# Patient Record
Sex: Male | Born: 1985 | Race: White | Hispanic: Yes | Marital: Single
Health system: Southern US, Community
[De-identification: ages and names within clinical notes are randomized; demographics above are authoritative.]

---

## 2016-03-01 ENCOUNTER — Emergency Department (HOSPITAL_COMMUNITY): Payer: No Typology Code available for payment source

## 2016-03-01 ENCOUNTER — Emergency Department (HOSPITAL_COMMUNITY)
Admission: EM | Admit: 2016-03-01 | Discharge: 2016-03-01 | Disposition: A | Discharge status: Home / Self Care | Payer: No Typology Code available for payment source | Attending: Emergency Medicine | Admitting: Emergency Medicine

## 2016-03-01 ENCOUNTER — Encounter (HOSPITAL_COMMUNITY): Payer: Self-pay

## 2016-03-01 DIAGNOSIS — S40819A Abrasion of unspecified upper arm, initial encounter: Secondary | ICD-10-CM

## 2016-03-01 DIAGNOSIS — S41112A Laceration without foreign body of left upper arm, initial encounter: Secondary | ICD-10-CM | POA: Diagnosis present

## 2016-03-01 DIAGNOSIS — Y92410 Unspecified street and highway as the place of occurrence of the external cause: Secondary | ICD-10-CM | POA: Insufficient documentation

## 2016-03-01 DIAGNOSIS — Y9389 Activity, other specified: Secondary | ICD-10-CM | POA: Insufficient documentation

## 2016-03-01 DIAGNOSIS — S40812A Abrasion of left upper arm, initial encounter: Secondary | ICD-10-CM | POA: Diagnosis not present

## 2016-03-01 DIAGNOSIS — S301XXA Contusion of abdominal wall, initial encounter: Secondary | ICD-10-CM | POA: Insufficient documentation

## 2016-03-01 DIAGNOSIS — Y998 Other external cause status: Secondary | ICD-10-CM | POA: Insufficient documentation

## 2016-03-01 DIAGNOSIS — Z23 Encounter for immunization: Secondary | ICD-10-CM | POA: Diagnosis not present

## 2016-03-01 LAB — COMPREHENSIVE METABOLIC PANEL
ALT: 31 U/L (ref 17–63)
AST: 32 U/L (ref 15–41)
Albumin: 4.1 g/dL (ref 3.5–5.0)
Alkaline Phosphatase: 71 U/L (ref 38–126)
Anion gap: 11 (ref 5–15)
BUN: 11 mg/dL (ref 6–20)
CALCIUM: 8.9 mg/dL (ref 8.9–10.3)
CO2: 22 mmol/L (ref 22–32)
CREATININE: 0.92 mg/dL (ref 0.61–1.24)
Chloride: 105 mmol/L (ref 101–111)
GFR calc Af Amer: >60 mL/min (ref >60)
GLUCOSE: 106 mg/dL — AB (ref 65–99)
Potassium: 3.8 mmol/L (ref 3.5–5.1)
Sodium: 138 mmol/L (ref 135–145)
TOTAL PROTEIN: 6.8 g/dL (ref 6.5–8.1)
Total Bilirubin: 0.6 mg/dL (ref 0.3–1.2)

## 2016-03-01 LAB — CBC WITH DIFFERENTIAL/PLATELET
BASOS ABS: 0 10*3/uL (ref 0.0–0.1)
BASOS PCT: 0 %
EOS PCT: 2 %
Eosinophils Absolute: 0.3 10*3/uL (ref 0.0–0.7)
HCT: 45.5 % (ref 39.0–52.0)
Hemoglobin: 15.7 g/dL (ref 13.0–17.0)
Lymphocytes Relative: 15 %
Lymphs Abs: 1.9 10*3/uL (ref 0.7–4.0)
MCH: 30 pg (ref 26.0–34.0)
MCHC: 34.5 g/dL (ref 30.0–36.0)
MCV: 86.8 fL (ref 78.0–100.0)
MONO ABS: 0.5 10*3/uL (ref 0.1–1.0)
Monocytes Relative: 4 %
Neutro Abs: 9.6 10*3/uL — ABNORMAL HIGH (ref 1.7–7.7)
Neutrophils Relative %: 79 %
PLATELETS: 209 10*3/uL (ref 150–400)
RBC: 5.24 MIL/uL (ref 4.22–5.81)
RDW: 13.3 % (ref 11.5–15.5)
WBC: 12.2 10*3/uL — ABNORMAL HIGH (ref 4.0–10.5)

## 2016-03-01 LAB — PROTIME-INR
INR: 1.05 (ref 0.00–1.49)
PROTHROMBIN TIME: 13.9 s (ref 11.6–15.2)

## 2016-03-01 MED ORDER — CEPHALEXIN 500 MG PO CAPS: 500.0000 mg | ORAL_CAPSULE | Freq: Three times a day (TID) | ORAL | Status: AC

## 2016-03-01 MED ORDER — LIDOCAINE-EPINEPHRINE (PF) 2 %-1:200000 IJ SOLN
10.0000 mL | Freq: Once | INTRAMUSCULAR | Status: AC
  Administered 2016-03-01: 10 mL
  Filled 2016-03-01: qty 20

## 2016-03-01 MED ORDER — MORPHINE SULFATE (PF) 4 MG/ML IV SOLN
4.0000 mg | Freq: Once | INTRAVENOUS | Status: AC
  Administered 2016-03-01: 4 mg via INTRAVENOUS
  Filled 2016-03-01: qty 1

## 2016-03-01 MED ORDER — TETANUS-DIPHTH-ACELL PERTUSSIS 5-2.5-18.5 LF-MCG/0.5 IM SUSP
0.5000 mL | Freq: Once | INTRAMUSCULAR | Status: AC
  Administered 2016-03-01: 0.5 mL via INTRAMUSCULAR
  Filled 2016-03-01: qty 0.5

## 2016-03-01 MED ORDER — ONDANSETRON HCL 4 MG/2ML IJ SOLN
4.0000 mg | Freq: Once | INTRAMUSCULAR | Status: AC
  Administered 2016-03-01: 4 mg via INTRAVENOUS
  Filled 2016-03-01: qty 2

## 2016-03-01 MED ORDER — NAPROXEN 500 MG PO TABS
500.0000 mg | ORAL_TABLET | Freq: Two times a day (BID) | ORAL | Status: AC
Start: 2016-03-01 — End: ?

## 2016-03-01 MED ORDER — IOPAMIDOL (ISOVUE-300) INJECTION 61%
INTRAVENOUS | Status: AC
  Administered 2016-03-01: 100 mL
  Filled 2016-03-01: qty 100

## 2016-03-01 MED ORDER — CEPHALEXIN 250 MG PO CAPS
500.0000 mg | ORAL_CAPSULE | Freq: Once | ORAL | Status: AC
  Administered 2016-03-01: 500 mg via ORAL
  Filled 2016-03-01: qty 2

## 2016-03-01 MED ORDER — METHOCARBAMOL 500 MG PO TABS: 500.0000 mg | ORAL_TABLET | Freq: Two times a day (BID) | ORAL | Status: AC

## 2016-03-01 MED ORDER — HYDROCODONE-ACETAMINOPHEN 5-325 MG PO TABS: 1.0000 | ORAL_TABLET | ORAL | Status: AC | PRN
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
## 2020-02-23 ENCOUNTER — Ambulatory Visit: Payer: Self-pay | Attending: Internal Medicine

## 2020-02-23 DIAGNOSIS — Z23 Encounter for immunization: Secondary | ICD-10-CM

## 2020-02-23 NOTE — Progress Notes (Signed)
   Covid-19 Vaccination Clinic  Name:  Brodin Gelpi    MRN: [id number] DOB: 1986-05-15  02/23/2020  Mr. Thall was observed post Covid-19 immunization for 15 minutes without incident. He was provided with Vaccine Information Sheet and instruction to access the V-Safe system.   Mr. Wengert was instructed to call 911 with any severe reactions post vaccine: Marland Kitchen Difficulty breathing  . Swelling of face and throat  . A fast heartbeat  . A bad rash all over body  . Dizziness and weakness   Immunizations Administered    Name Date Dose VIS Date Route   Pfizer COVID-19 Vaccine 02/23/2020  5:06 PM 0.3 mL 11/10/2019 Intramuscular   Manufacturer: [employer], [employer]   Lot: [id number]   NDC: [address]-7

## 2020-03-15 ENCOUNTER — Ambulatory Visit: Payer: Self-pay | Attending: Internal Medicine

## 2020-03-15 DIAGNOSIS — Z23 Encounter for immunization: Secondary | ICD-10-CM

## 2020-03-15 NOTE — Progress Notes (Signed)
   Covid-19 Vaccination Clinic  Name:  Tony Schmidt    MRN: [id number] DOB: Jun 15, 1986  03/15/2020  [name] was observed post Covid-19 immunization for 15 minutes without incident. He was provided with Vaccine Information Sheet and instruction to access the V-Safe system.   [name] was instructed to call 911 with any severe reactions post vaccine: Marland Kitchen Difficulty breathing  . Swelling of face and throat  . A fast heartbeat  . A bad rash all over body  . Dizziness and weakness   Immunizations Administered    Name Date Dose VIS Date Route   Pfizer COVID-19 Vaccine 03/15/2020  5:09 PM 0.3 mL 11/10/2019 Intramuscular   Manufacturer: [employer], [employer]   Lot: [id number]   NDC: [address]-0
